# Patient Record
Sex: Male | Born: 1995 | Race: Black or African American | Hispanic: No | Marital: Single | Smoking: Never smoker
Health system: Southern US, Community
[De-identification: ages and names within clinical notes are randomized; demographics above are authoritative.]

## PROBLEM LIST (undated history)

## (undated) DIAGNOSIS — J45909 Unspecified asthma, uncomplicated: Secondary | ICD-10-CM

---

## 2020-04-28 ENCOUNTER — Encounter (HOSPITAL_COMMUNITY): Payer: Self-pay | Admitting: Obstetrics and Gynecology

## 2020-04-28 ENCOUNTER — Other Ambulatory Visit: Payer: Self-pay

## 2020-04-28 ENCOUNTER — Emergency Department (HOSPITAL_COMMUNITY)
Admission: EM | Admit: 2020-04-28 | Discharge: 2020-04-28 | Disposition: A | Discharge status: Home / Self Care | Payer: No Typology Code available for payment source | Attending: Emergency Medicine | Admitting: Emergency Medicine

## 2020-04-28 ENCOUNTER — Emergency Department (HOSPITAL_COMMUNITY): Payer: No Typology Code available for payment source

## 2020-04-28 DIAGNOSIS — J45909 Unspecified asthma, uncomplicated: Secondary | ICD-10-CM | POA: Insufficient documentation

## 2020-04-28 DIAGNOSIS — S8991XA Unspecified injury of right lower leg, initial encounter: Secondary | ICD-10-CM | POA: Diagnosis present

## 2020-04-28 DIAGNOSIS — Y990 Civilian activity done for income or pay: Secondary | ICD-10-CM | POA: Diagnosis not present

## 2020-04-28 DIAGNOSIS — Y93F2 Activity, caregiving, lifting: Secondary | ICD-10-CM | POA: Insufficient documentation

## 2020-04-28 DIAGNOSIS — Y9289 Other specified places as the place of occurrence of the external cause: Secondary | ICD-10-CM | POA: Diagnosis not present

## 2020-04-28 DIAGNOSIS — W228XXA Striking against or struck by other objects, initial encounter: Secondary | ICD-10-CM | POA: Diagnosis not present

## 2020-04-28 DIAGNOSIS — S8011XA Contusion of right lower leg, initial encounter: Secondary | ICD-10-CM | POA: Insufficient documentation

## 2020-04-28 HISTORY — DX: Unspecified asthma, uncomplicated: J45.909

## 2020-04-28 MED ORDER — IBUPROFEN 800 MG PO TABS
800.0000 mg | ORAL_TABLET | Freq: Once | ORAL | Status: AC
  Administered 2020-04-28: 22:00:00 800 mg via ORAL
  Filled 2020-04-28: qty 1

## 2020-04-28 MED ORDER — IBUPROFEN 600 MG PO TABS: 600.0000 mg | ORAL_TABLET | Freq: Four times a day (QID) | ORAL | 0 refills | Status: AC | PRN

## 2020-04-28 MED ORDER — CYCLOBENZAPRINE HCL 10 MG PO TABS: 10.0000 mg | ORAL_TABLET | Freq: Two times a day (BID) | ORAL | 0 refills | Status: AC | PRN

## 2020-04-28 NOTE — ED Provider Notes (Signed)
[facility]-EMERGENCY DEPT Provider Note   CSN: [id number] Arrival date & time: 04/28/20  1810     History Chief Complaint  Patient presents with  . Leg Injury    Kirk Ray is a 24 y.o. male.  The history is provided by the patient. No language interpreter was used.     24 year old male presenting for evaluation of leg injury.  Patient report he works for [employer].  Today, while carrying some heavy box one of them fell, struck the back of his right leg.  He felt a snap and then he noticed pain in the back of his heel radiates towards his calf.  Incident happened approximately 12 hours ago.  He is able to ambulate but states pain has been moving up and down his leg and feels very uncomfortable.  Rates pain as 8 out of 10.  Does not complain of any fever or numbness.  He denies any specific treatment tried.  Past Medical History:  Diagnosis Date  . Asthma     There are no problems to display for this patient.   The histories are not reviewed yet. Please review them in the "History" navigator section and refresh this SmartLink.     No family history on file.  Social History   Tobacco Use  . Smoking status: Never Smoker  Substance Use Topics  . Alcohol use: Not Currently  . Drug use: Not Currently    Home Medications Prior to Admission medications   Not on File    Allergies    Patient has no allergy information on record.  Review of Systems   Review of Systems  Constitutional: Negative for fever.  Musculoskeletal: Positive for arthralgias.  Skin: Negative for wound.  Neurological: Negative for numbness.    Physical Exam Updated Vital Signs BP (!) 148/94 (BP Location: Left Arm)   Pulse 78   Temp 98.1 F (36.7 C) (Oral)   Resp 16   SpO2 98%   Physical Exam Vitals and nursing note reviewed.  Constitutional:      General: He is not in acute distress.    Appearance: He is well-developed and well-nourished.  HENT:     Head:  Atraumatic.  Eyes:     Conjunctiva/sclera: Conjunctivae normal.  Musculoskeletal:        General: Tenderness (Right lower extremity: Tenderness to palpation of the calf and at the Achilles tendon without any obvious deformity swelling or bruising noted.  Calf compartment is soft.  Decreased dorsiflexion and plantarflexion secondary to pain.  DP pulse palpable ) present.     Cervical back: Neck supple.  Skin:    Findings: No rash.  Neurological:     Mental Status: He is alert.  Psychiatric:        Mood and Affect: Mood and affect normal.     ED Results / Procedures / Treatments   Labs (all labs ordered are listed, but only abnormal results are displayed) Labs Reviewed - No data to display  EKG None  Radiology DG Ankle Complete Right  Result Date: 04/28/2020 CLINICAL DATA:  24 year old male with trauma to the right ankle. EXAM: RIGHT ANKLE - COMPLETE 3+ VIEW COMPARISON:  Right foot radiograph dated 04/28/2020. FINDINGS: There is no evidence of fracture, dislocation, or joint effusion. There is no evidence of arthropathy or other focal bone abnormality. Soft tissues are unremarkable. IMPRESSION: Negative. Electronically Signed   By: Elgie Collard M.D.   On: 04/28/2020 19:04   DG Foot Complete  Right  Result Date: 04/28/2020 CLINICAL DATA:  Foot injury. EXAM: RIGHT FOOT COMPLETE - 3+ VIEW COMPARISON:  None. FINDINGS: There is no evidence of fracture or dislocation. There is no evidence of arthropathy or other focal bone abnormality. Soft tissues are unremarkable. IMPRESSION: Negative. Electronically Signed   By: Charlett Nose M.D.   On: 04/28/2020 19:06    Procedures Procedures (including critical care time)  Medications Ordered in ED Medications  ibuprofen (ADVIL) tablet 800 mg (800 mg Oral Given 04/28/20 2149)    ED Course  I have reviewed the triage vital signs and the nursing notes.  Pertinent labs & imaging results that were available during my care of the patient were  reviewed by me and considered in my medical decision making (see chart for details).    MDM Rules/Calculators/A&P                          BP (!) 148/94 (BP Location: Left Arm)   Pulse 78   Temp 98.1 F (36.7 C) (Oral)   Resp 16   SpO2 98%   Final Clinical Impression(s) / ED Diagnoses Final diagnoses:  Contusion of right lower leg, initial encounter    Rx / DC Orders ED Discharge Orders         Ordered    ibuprofen (ADVIL) 600 MG tablet  Every 6 hours PRN        04/28/20 2300    cyclobenzaprine (FLEXERIL) 10 MG tablet  2 times daily PRN        04/28/20 2300         Patient report a heavy box dropped to the back of his right heel causing pain radiates towards his calf.  Incident happened earlier today.  He does have tenderness about the calf but is able to mildly dorsiflex and plantarflex his foot.  Although this could be an Achilles tendon rupture, I do not see any obvious finding to support this.  X-ray of the right ankle and right foot unremarkable.  Plan to place patient in an ankle brace, provide crutches, rice therapy, and will also provide referral to orthopedist for outpatient follow-up.  If symptoms persist patient may benefit from an MRI.  Work note provided.  Return precautions discussed.  No findings to suggest compartment syndrome.   Fayrene Helper, PA-C 04/28/20 2303    Mancel Bale, MD 04/29/20 [phone]

## 2020-04-28 NOTE — ED Triage Notes (Signed)
Patient reports he was working and the back heel of his right foot got hit by some heavy boxes. Patient felt a snap and then pain radiated up to the back of the calf and he reports trouble walking, driving, and pointing toes

## 2020-04-28 NOTE — Discharge Instructions (Addendum)
You have been evaluated for your leg injury.  Fortunately x-ray of your foot and ankle did not show any broken bones or dislocation.  This could be a simple bruising of your leg causing pain however one potential injury could be a muscle tear or tendon injury.  Wear brace as needed, use crutches as needed but if your symptoms persist, please consider follow-up with orthopedist for further care as an MRI may be beneficial.

## 2022-06-03 IMAGING — CR DG FOOT COMPLETE 3+V*R*
3 series · 3 of 3 positions shown · non-contrast
Comparison: None.

CLINICAL DATA: Foot injury.

EXAM:
RIGHT FOOT COMPLETE - 3+ VIEW

[x foot ap right]
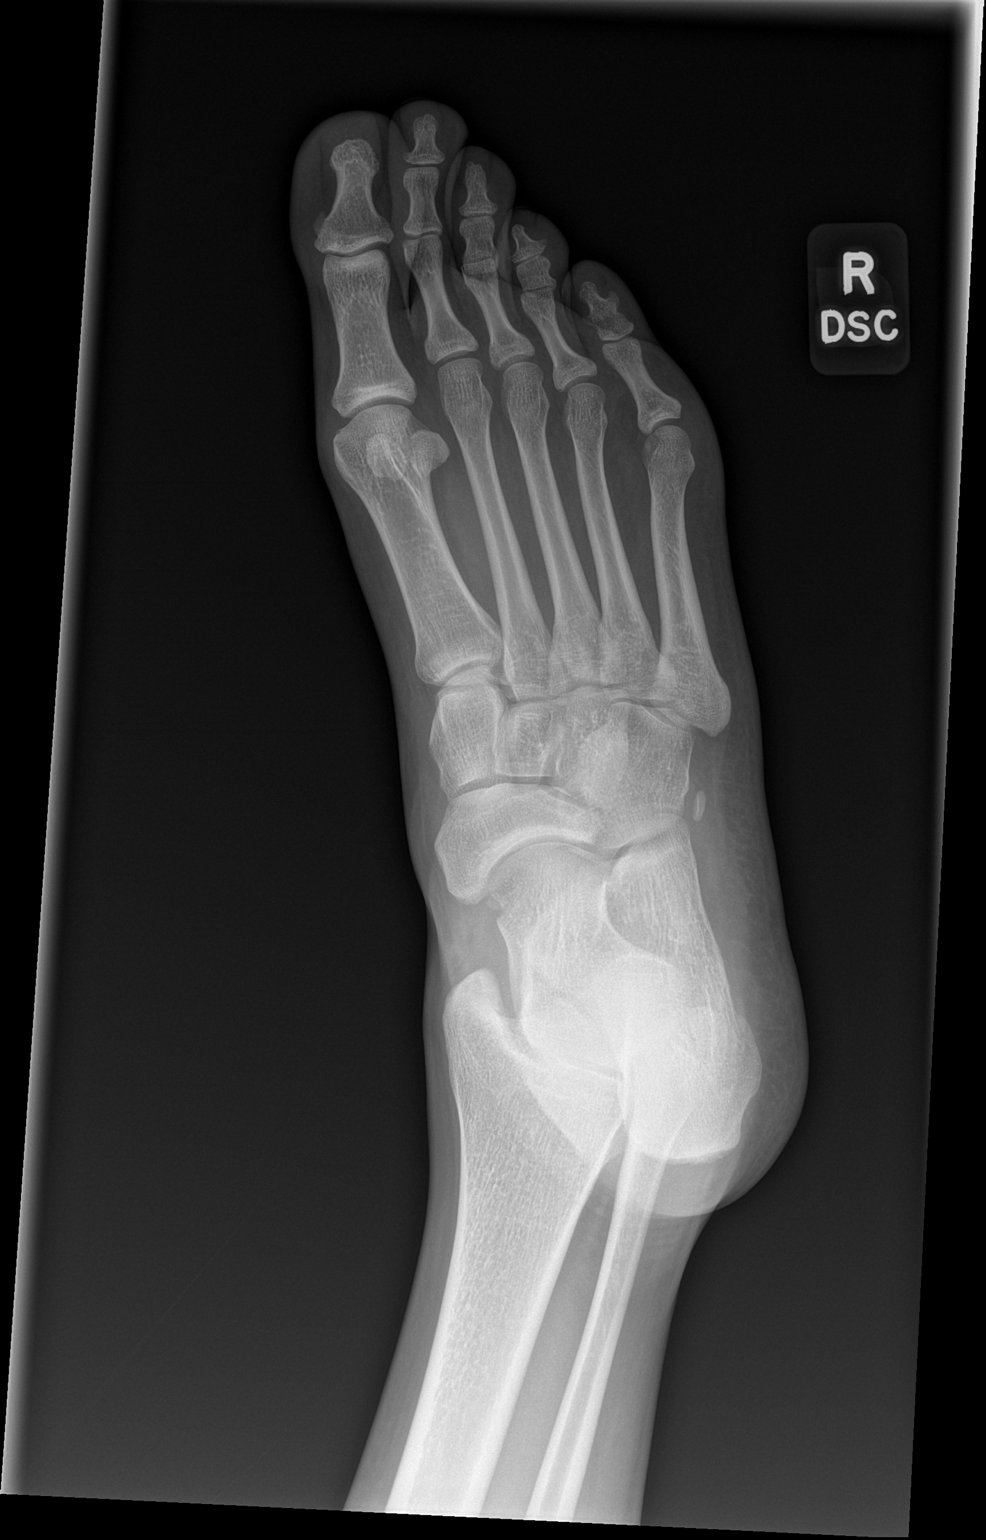

[x foot obl right]
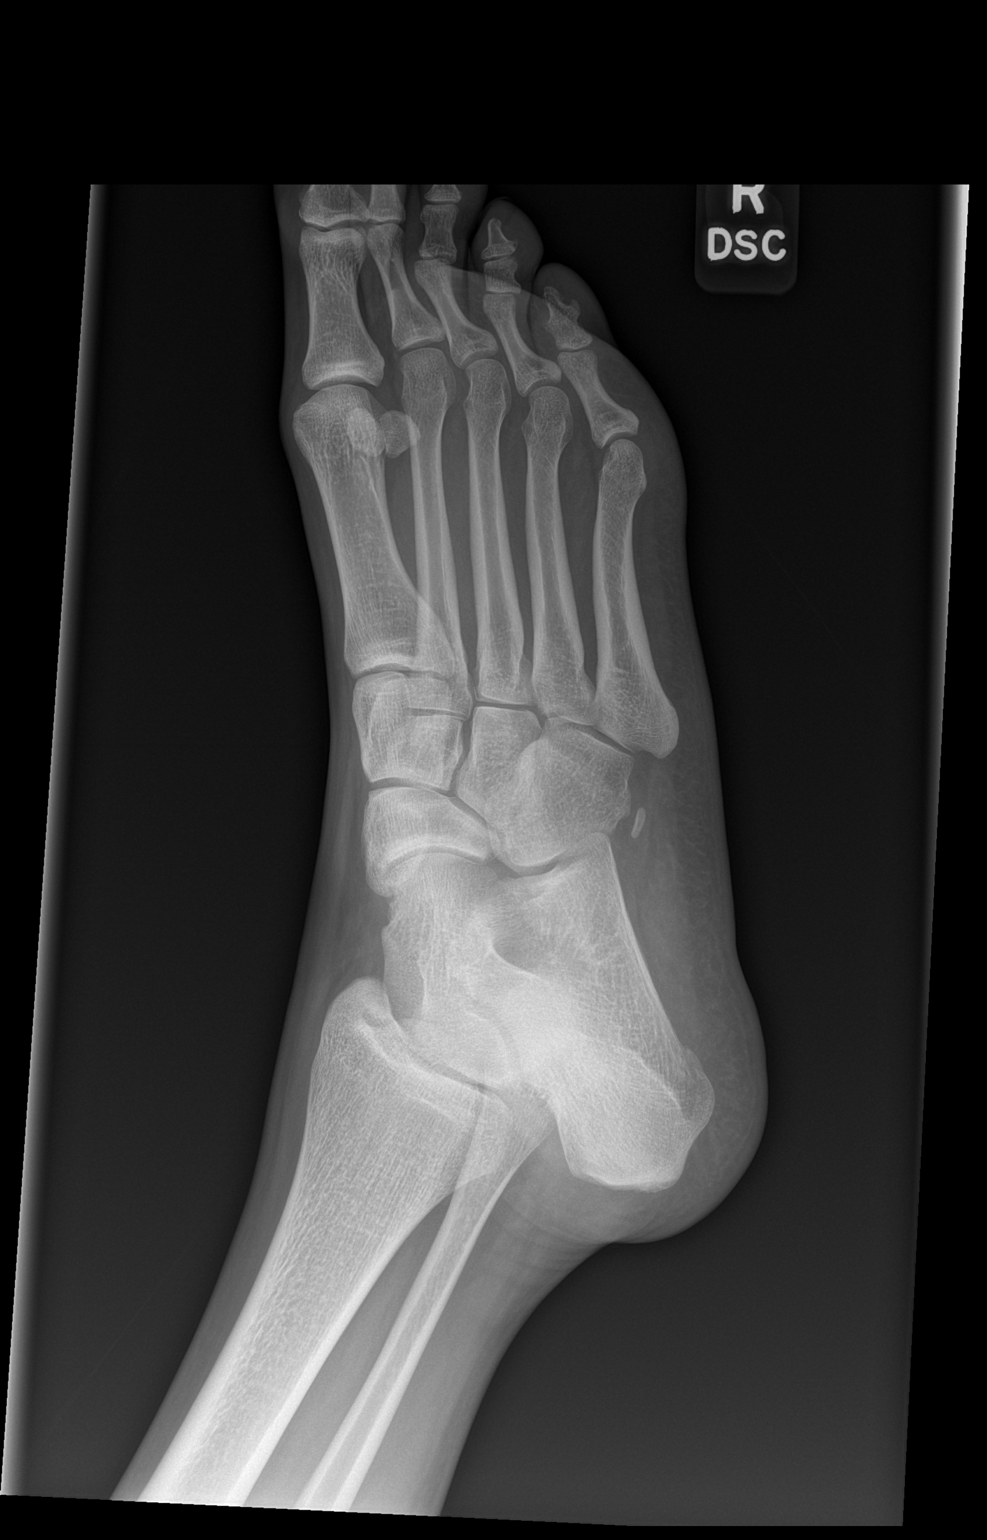

[x foot lat right]
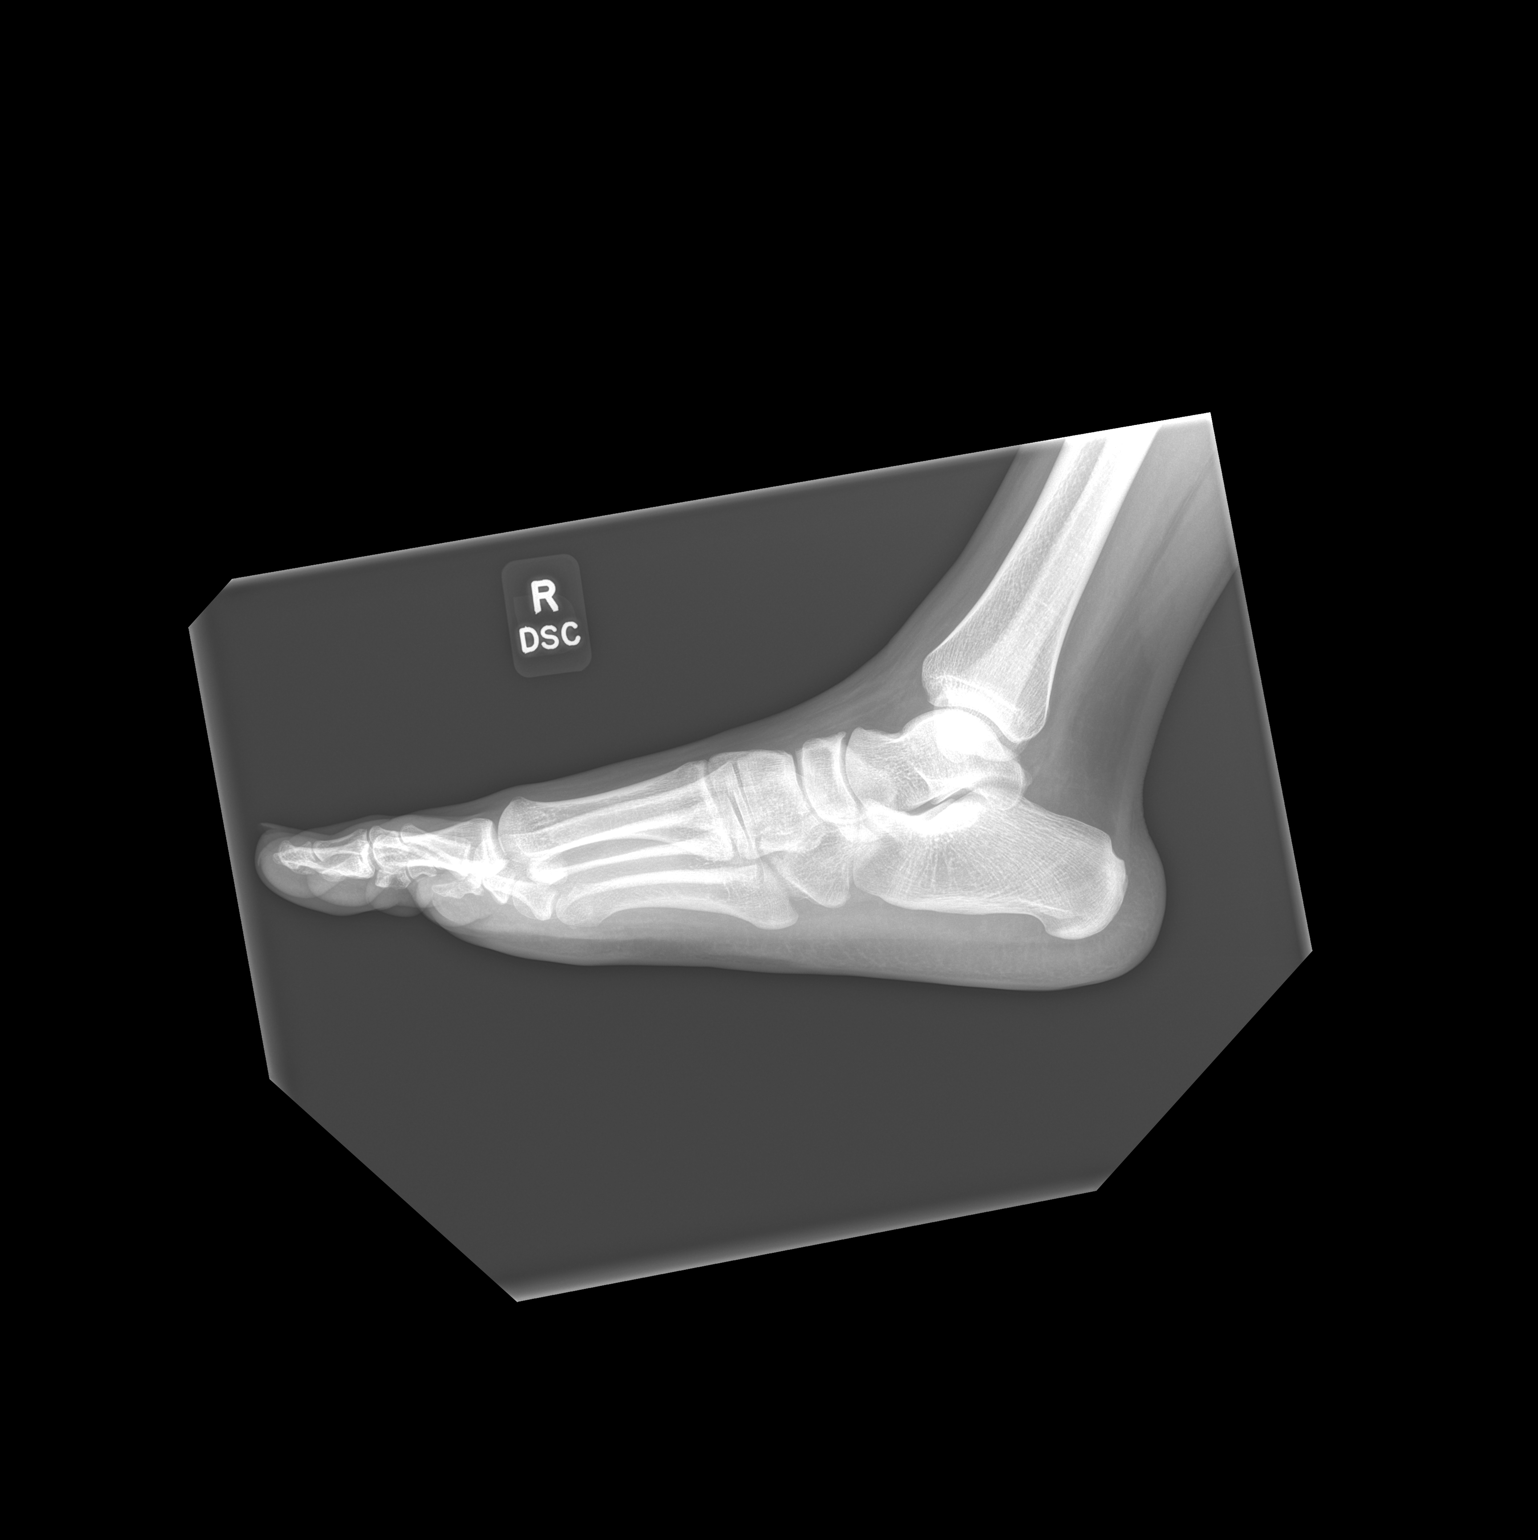

[3 of 3 positions shown; findings below may reference images not displayed]

FINDINGS: There is no evidence of fracture or dislocation. There is no
evidence of arthropathy or other focal bone abnormality. Soft
tissues are unremarkable.
IMPRESSION: Negative.

## 2024-05-14 DEATH — deceased
